# Patient Record
Sex: Male | Born: 1970 | Race: White | Hispanic: No | Marital: Married | Smoking: Never smoker
Health system: Southern US, Community
[De-identification: ages and names within clinical notes are randomized; demographics above are authoritative.]

## PROBLEM LIST (undated history)

## (undated) DIAGNOSIS — E7800 Pure hypercholesterolemia, unspecified: Secondary | ICD-10-CM

---

## 2009-03-15 ENCOUNTER — Ambulatory Visit (HOSPITAL_COMMUNITY): Admission: RE | Admit: 2009-03-15 | Discharge: 2009-03-15 | Payer: Self-pay | Admitting: Family Medicine

## 2011-01-20 ENCOUNTER — Encounter: Payer: Self-pay | Admitting: *Deleted

## 2011-01-20 ENCOUNTER — Emergency Department (HOSPITAL_COMMUNITY)
Admission: EM | Admit: 2011-01-20 | Discharge: 2011-01-20 | Disposition: A | Discharge status: Home / Self Care | Payer: BC Managed Care – PPO | Attending: Emergency Medicine | Admitting: Emergency Medicine

## 2011-01-20 DIAGNOSIS — Z882 Allergy status to sulfonamides status: Secondary | ICD-10-CM | POA: Insufficient documentation

## 2011-01-20 DIAGNOSIS — S61209A Unspecified open wound of unspecified finger without damage to nail, initial encounter: Secondary | ICD-10-CM | POA: Insufficient documentation

## 2011-01-20 DIAGNOSIS — W260XXA Contact with knife, initial encounter: Secondary | ICD-10-CM | POA: Insufficient documentation

## 2011-01-20 DIAGNOSIS — Y92009 Unspecified place in unspecified non-institutional (private) residence as the place of occurrence of the external cause: Secondary | ICD-10-CM | POA: Insufficient documentation

## 2011-01-20 DIAGNOSIS — W261XXA Contact with sword or dagger, initial encounter: Secondary | ICD-10-CM | POA: Insufficient documentation

## 2011-01-20 DIAGNOSIS — Z880 Allergy status to penicillin: Secondary | ICD-10-CM | POA: Insufficient documentation

## 2011-01-20 DIAGNOSIS — S61219A Laceration without foreign body of unspecified finger without damage to nail, initial encounter: Secondary | ICD-10-CM

## 2011-01-20 MED ORDER — TETANUS-DIPHTH-ACELL PERTUSSIS 5-2-15.5 LF-MCG/0.5 IM SUSP: 0.5000 mL | Freq: Once | INTRAMUSCULAR | Status: DC

## 2011-01-20 MED ORDER — TETANUS-DIPHTH-ACELL PERTUSSIS 5-2.5-18.5 LF-MCG/0.5 IM SUSP
INTRAMUSCULAR | Status: AC
  Administered 2011-01-20: 0.5 mL via INTRAMUSCULAR
  Filled 2011-01-20: qty 0.5

## 2011-01-20 NOTE — ED Provider Notes (Signed)
History     CSN: [id number] Arrival date & time: 01/20/2011  6:18 PM   Chief Complaint  Patient presents with  . Extremity Laceration     (Include location/radiation/quality/duration/timing/severity/associated sxs/prior treatment) The history is provided by the patient.   injury to left distal index finger is well working on his truck.  Patient cut his left index finger with a pocket knife just prior to arrival the patient denies weakness or tingling of the finger.  He reports no difficulty history and minimal blood loss at the scene.  Pain is mild.  Nothing worsens or improves the pain. History reviewed. No pertinent past medical history.   History reviewed. No pertinent past surgical history.  No family history on file.  History  Substance Use Topics  . Smoking status: Never Smoker   . Smokeless tobacco: Not on file  . Alcohol Use: No      Review of Systems  All other systems reviewed and are negative.    Allergies  Penicillins and Sulfa antibiotics  Home Medications  No current outpatient prescriptions on file.  Physical Exam    BP 130/78  Pulse 81  Temp(Src) 98.6 F (37 C) (Oral)  Resp 16  SpO2 100%  Physical Exam  Constitutional: He is oriented to person, place, and time. He appears well-developed and well-nourished.  HENT:  Head: Normocephalic.  Eyes: EOM are normal.  Neck: Normal range of motion.  Pulmonary/Chest: Effort normal.  Musculoskeletal: Normal range of motion.       Dorsal surface of left distal phalanx of the index finger small avulsion without bleeding or secondary signs of infection.  This avulsion is on the radial aspect.  Normal distal perfusion.  Normal flexor and extensor tendon function  Neurological: He is alert and oriented to person, place, and time.  Psychiatric: He has a normal mood and affect.    ED Course  LACERATION REPAIR Date/Time: 01/20/2011 7:11 PM Performed by: Lyanne Co Authorized by: Lyanne Co Consent: Verbal consent obtained. Risks and benefits: risks, benefits and alternatives were discussed Consent given by: patient Patient identity confirmed: verbally with patient Body area: upper extremity Location details: left index finger Laceration length: 1 cm Foreign bodies: no foreign bodies Tendon involvement: none Nerve involvement: none Vascular damage: no Anesthesia method: none. Patient sedated: no Amount of cleaning: standard (Cleaned with wet gauze) Skin closure: glue Approximation: close Approximation difficulty: simple Patient tolerance: Patient tolerated the procedure well with no immediate complications.    No results found for this or any previous visit. No results found.   1. Laceration of finger      MDM Tetanus updated.  Superficial laceration repaired with Dermabond.  Infection warnings given       Lyanne Co, MD 01/20/11 [phone]

## 2011-01-20 NOTE — ED Notes (Signed)
Pt's wife called and spoke with me stating she did not like how her husband was treated; I apologized and told her she could call back and speak with the Director and she stated she would be calling her insurance company to let them know that her insurance was not appreciated here and she hung up

## 2011-01-20 NOTE — ED Notes (Signed)
Pt states he cut 2nd left finger with a knife at ~1700. Bleeding controlled. Pt states Last Tdap is unknown but >5 years.

## 2018-12-04 ENCOUNTER — Other Ambulatory Visit: Payer: Self-pay

## 2018-12-04 ENCOUNTER — Encounter (HOSPITAL_COMMUNITY): Payer: Self-pay

## 2018-12-04 ENCOUNTER — Emergency Department (HOSPITAL_COMMUNITY): Payer: 59

## 2018-12-04 ENCOUNTER — Emergency Department (HOSPITAL_COMMUNITY)
Admission: EM | Admit: 2018-12-04 | Discharge: 2018-12-04 | Disposition: A | Discharge status: Home / Self Care | Payer: 59 | Attending: Emergency Medicine | Admitting: Emergency Medicine

## 2018-12-04 DIAGNOSIS — M79644 Pain in right finger(s): Secondary | ICD-10-CM

## 2018-12-04 MED ORDER — CLINDAMYCIN HCL 300 MG PO CAPS: 300.0000 mg | ORAL_CAPSULE | Freq: Three times a day (TID) | ORAL | 0 refills | Status: AC

## 2018-12-04 MED ORDER — CLINDAMYCIN HCL 300 MG PO CAPS: 300.0000 mg | ORAL_CAPSULE | Freq: Three times a day (TID) | ORAL | 0 refills | Status: DC

## 2018-12-04 MED ORDER — HYDROCODONE-ACETAMINOPHEN 5-325 MG PO TABS: 1.0000 | ORAL_TABLET | Freq: Four times a day (QID) | ORAL | 0 refills | Status: DC | PRN

## 2018-12-04 MED ORDER — CLINDAMYCIN HCL 150 MG PO CAPS
300.0000 mg | ORAL_CAPSULE | Freq: Once | ORAL | Status: AC
  Administered 2018-12-04: 22:00:00 300 mg via ORAL
  Filled 2018-12-04: qty 2

## 2018-12-04 NOTE — Discharge Instructions (Signed)
Take the antibiotics as prescribed.  Please do warm soaks to the area approximately 4 times a day.  If you notice additional swelling at the nail bed, drainage please seek reevaluation.  Seems to be an early paronychia which is infection of the nailbed.  These do sometimes require drainage however years does not appear to need drainage at this time.  Take Tylenol and ibuprofen as needed for pain.

## 2018-12-04 NOTE — ED Triage Notes (Signed)
Pt states swelling of right thumb since yesterday. 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 Notable redness to area.

## 2018-12-04 NOTE — ED Provider Notes (Signed)
[facility] EMERGENCY DEPARTMENT Provider Note   CSN: [id number] Arrival date & time: 12/04/18  2035  History   Chief Complaint Chief Complaint  Patient presents with  . Finger Injury   HPI Scott Gould is a 48 y.o. male with no significant past medical history who presents for evaluation of right thumb pain.  Patient states he has had pain to his right distal thumb which began yesterday.  Has been taking ibuprofen without relief of his pain.  Describes his pain as nagging.  Rates his current pain a 6/10.  Denies radiation pain denies prior history of diabetes, osteomyelitis, abscess.  Has noted some mild erythema to the proximal nail fold.  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  Denies additional aggravating or alleviating factors  History obtained from patient and past medical records.  No interpreter is used.     HPI  History reviewed. No pertinent past medical history.  There are no active problems to display for this patient.   No past surgical history on file.      Home Medications    Prior to Admission medications   Medication Sig Start Date End Date Taking? Authorizing Provider  clindamycin (CLEOCIN) 300 MG capsule Take 1 capsule (300 mg total) by mouth 3 (three) times daily for 5 days. 12/04/18 12/09/18  Andrey Mccaskill A, PA-C  HYDROcodone-acetaminophen (NORCO/VICODIN) 5-325 MG tablet Take 1 tablet by mouth every 6 (six) hours as needed for severe pain. 12/04/18   Styles Fambro A, PA-C    Family History No family history on file.  Social History Social History   Tobacco Use  . Smoking status: Never Smoker  Substance Use Topics  . Alcohol use: No  . Drug use: No     Allergies   Penicillins and Sulfa antibiotics   Review of Systems Review of Systems  Constitutional: Negative.   HENT: Negative.   Respiratory: Negative.   Cardiovascular: Negative.   Gastrointestinal: Negative.    Genitourinary: Negative.   Musculoskeletal:       Right thumb pain  Skin: Negative.   Neurological: Negative.   All other systems reviewed and are negative.    Physical Exam Updated Vital Signs BP (!) 147/88 (BP Location: Left Arm)   Pulse 71   Temp 98 F (36.7 C) (Oral)   Resp 14   Ht 5\' 9"  (1.753 m)   Wt 90.7 kg   SpO2 100%   BMI 29.53 kg/m   Physical Exam Vitals signs and nursing note reviewed.  Constitutional:      General: He is not in acute distress.    Appearance: He is well-developed. He is not ill-appearing, toxic-appearing or diaphoretic.  HENT:     Head: Normocephalic and atraumatic.     Mouth/Throat:     Mouth: Mucous membranes are moist.  Eyes:     Pupils: Pupils are equal, round, and reactive to light.  Neck:     Musculoskeletal: Normal range of motion and neck supple.  Cardiovascular:     Rate and Rhythm: Normal rate and regular rhythm.     Pulses: Normal pulses.     Heart sounds: Normal heart sounds.  Pulmonary:     Effort: Pulmonary effort is normal. No respiratory distress.     Breath sounds: Normal breath sounds.  Abdominal:     General: Bowel sounds are normal. There is no distension.     Palpations: Abdomen is soft.  Musculoskeletal:  Normal range of motion.        General: No deformity or signs of injury.     Right elbow: Normal.    Left elbow: Normal.     Right wrist: Normal.     Left wrist: Normal.     Right forearm: Normal.     Left forearm: Normal.     Right hand: He exhibits tenderness. He exhibits normal range of motion, no bony tenderness, normal two-point discrimination, normal capillary refill, no deformity, no laceration and no swelling. Normal sensation noted. Normal strength noted.     Left hand: Normal.     Right lower leg: No edema.     Left lower leg: No edema.     Comments: Full range of motion to Right upper extremity without difficulty.  Full range of motion with flexion and extension of right thumb.  No tenderness  palpation to DIP or metacarpal.  No bony tenderness.  No obvious injury.  No tenderness to thenar eminence.  Skin:    General: Skin is warm and dry.     Comments: Mild erythema to right corner proximal nail fold of right thumb. No evidence of induration or fluctuance. No evidence of drainable abscess.  No rashes or lesions.  Brisk capillary refill.  Neurological:     General: No focal deficit present.     Mental Status: He is alert.     Sensory: Sensation is intact.     Motor: Motor function is intact.     Coordination: Coordination is intact.     Gait: Gait is intact.    ED Treatments / Results  Labs (all labs ordered are listed, but only abnormal results are displayed) Labs Reviewed - No data to display  EKG None  Radiology Dg Finger Thumb Right  Result Date: 12/04/2018 CLINICAL DATA:  Pain and swelling EXAM: RIGHT THUMB 2+V COMPARISON:  None. FINDINGS: Frontal, oblique, and lateral views were obtained. There is soft tissue swelling in the region of the first IP joint. A tiny calcification in the lateral aspect of the first IP joint is likely of arthropathic etiology. No evident acute fracture or dislocation. No joint space narrowing or erosion. IMPRESSION: Soft tissue swelling in the first IP joint. Small calcification in the dorsal, lateral aspect of the first IP joint is likely of arthropathic etiology. No bony destruction or erosion. No appreciable joint space narrowing. No evident fracture or dislocation. Electronically Signed   By: Bretta BangWilliam  Woodruff III M.D.   On: 12/04/2018 21:21    Procedures Procedures (including critical care time)  Medications Ordered in ED Medications  clindamycin (CLEOCIN) capsule 300 mg (has no administration in time range)    Initial Impression / Assessment and Plan / ED Course  I have reviewed the triage vital signs and the nursing notes.  Pertinent labs & imaging results that were available during my care of the patient were reviewed by me and  considered in my medical decision making (see chart for details).  48 year old male appears otherwise well presents for evaluation of left thumb pain.  Onset yesterday.  He is afebrile, nonseptic, non-ill-appearing.  Patient with mild erythema to right corner nail fold.  No evidence of fluctuance or induration.  Normal musculoskeletal exam with passive and active range of motion.Marland Kitchen.  He is neurovascularly intact without deficit.  No bony tenderness.  Exam consistent with early paronychia.  I do not see any evidence of obvious fluctuance to drain in the emergency department at this time.  No  evidence of herpetic whitlow, osteomyelitis, cellulitis.  Compartments soft.  No streaking. Exam unconcerning for flexor tenosynovitis. Dg thumb with soft tissue swelling. No underlying osteomyelitis, gas forming organism, fracture, dislocation, retain foreign body. Low suspicion for septic joint, gout, hemarthrosis.  Will DC with antibiotics, pain medicine, warm soaks.  Discussed with patient if area becomes fluctuant he may need drainage at that time and to seek reevaluation.   The patient has been appropriately medically screened and/or stabilized in the ED. I have low suspicion for any other emergent medical condition which would require further screening, evaluation or treatment in the ED or require inpatient management.  Patient is hemodynamically stable and in no acute distress.  Patient able to ambulate in department prior to ED.  Evaluation does not show acute pathology that would require ongoing or additional emergent interventions while in the emergency department or further inpatient treatment.  I have discussed the diagnosis with the patient and answered all questions.  Pain is been managed while in the emergency department and patient has no further complaints prior to discharge.  Patient is comfortable with plan discussed in room and is stable for discharge at this time.  I have discussed strict return  precautions for returning to the emergency department.  Patient was encouraged to follow-up with PCP/specialist refer to at discharge.     Final Clinical Impressions(s) / ED Diagnoses   Final diagnoses:  Pain of right thumb    ED Discharge Orders         Ordered    clindamycin (CLEOCIN) 300 MG capsule  3 times daily,   Status:  Discontinued     12/04/18 2114    HYDROcodone-acetaminophen (NORCO/VICODIN) 5-325 MG tablet  Every 6 hours PRN     12/04/18 2127    clindamycin (CLEOCIN) 300 MG capsule  3 times daily     12/04/18 2130           Hoby Kawai A, PA-C 12/04/18 2132    Fredia Sorrow, MD 12/09/18 [phone]

## 2019-05-13 ENCOUNTER — Other Ambulatory Visit: Payer: Self-pay

## 2019-05-13 ENCOUNTER — Ambulatory Visit: Payer: 59 | Attending: Internal Medicine

## 2019-05-13 DIAGNOSIS — Z20822 Contact with and (suspected) exposure to covid-19: Secondary | ICD-10-CM

## 2019-05-14 LAB — NOVEL CORONAVIRUS, NAA: SARS-CoV-2, NAA: NOT DETECTED

## 2019-06-03 ENCOUNTER — Other Ambulatory Visit: Payer: Self-pay

## 2019-06-03 ENCOUNTER — Ambulatory Visit: Payer: 59 | Attending: Internal Medicine

## 2019-06-03 DIAGNOSIS — Z20822 Contact with and (suspected) exposure to covid-19: Secondary | ICD-10-CM

## 2019-06-04 ENCOUNTER — Ambulatory Visit: Payer: Self-pay | Attending: Internal Medicine

## 2019-06-04 LAB — NOVEL CORONAVIRUS, NAA: SARS-CoV-2, NAA: NOT DETECTED

## 2020-09-25 IMAGING — CR RIGHT THUMB 2+V
1 series · 3 of 3 positions shown · non-contrast
Comparison: None.

CLINICAL DATA: Pain and swelling

EXAM:
RIGHT THUMB 2+V

[Series 2: pa · 0.17mm/px · 3 of 3 slices shown]
[im 1/3]
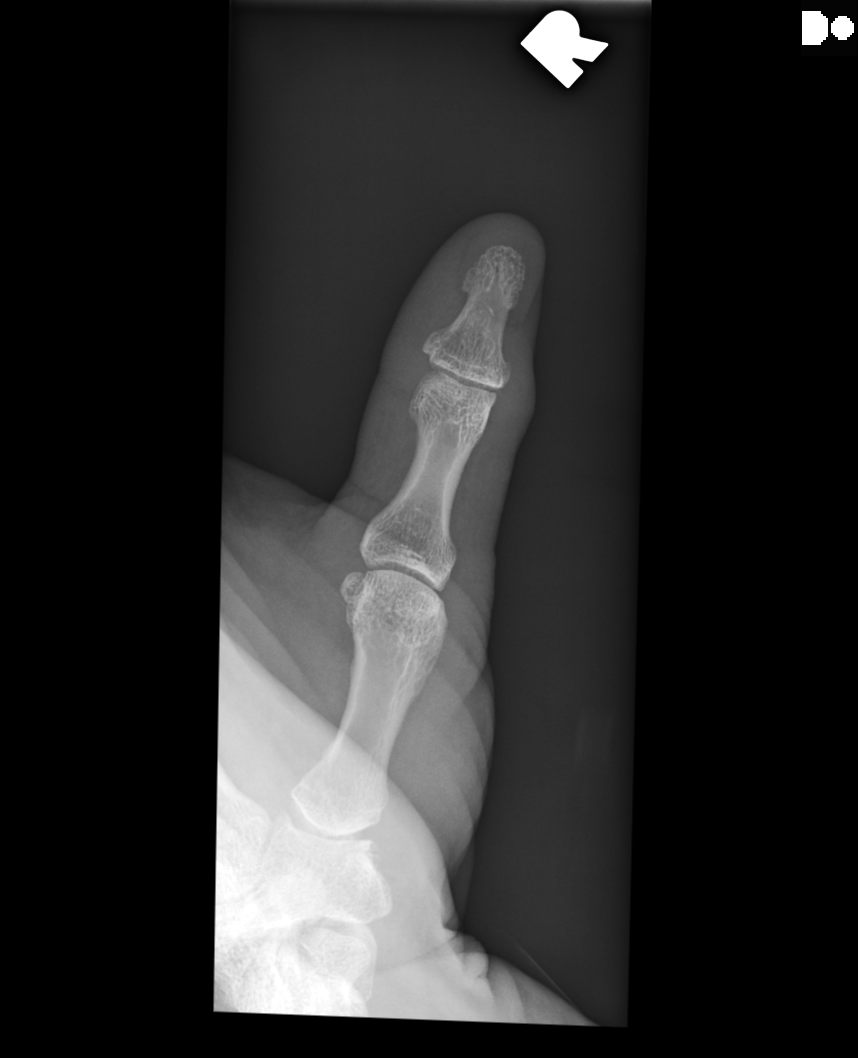
[im 2/3]
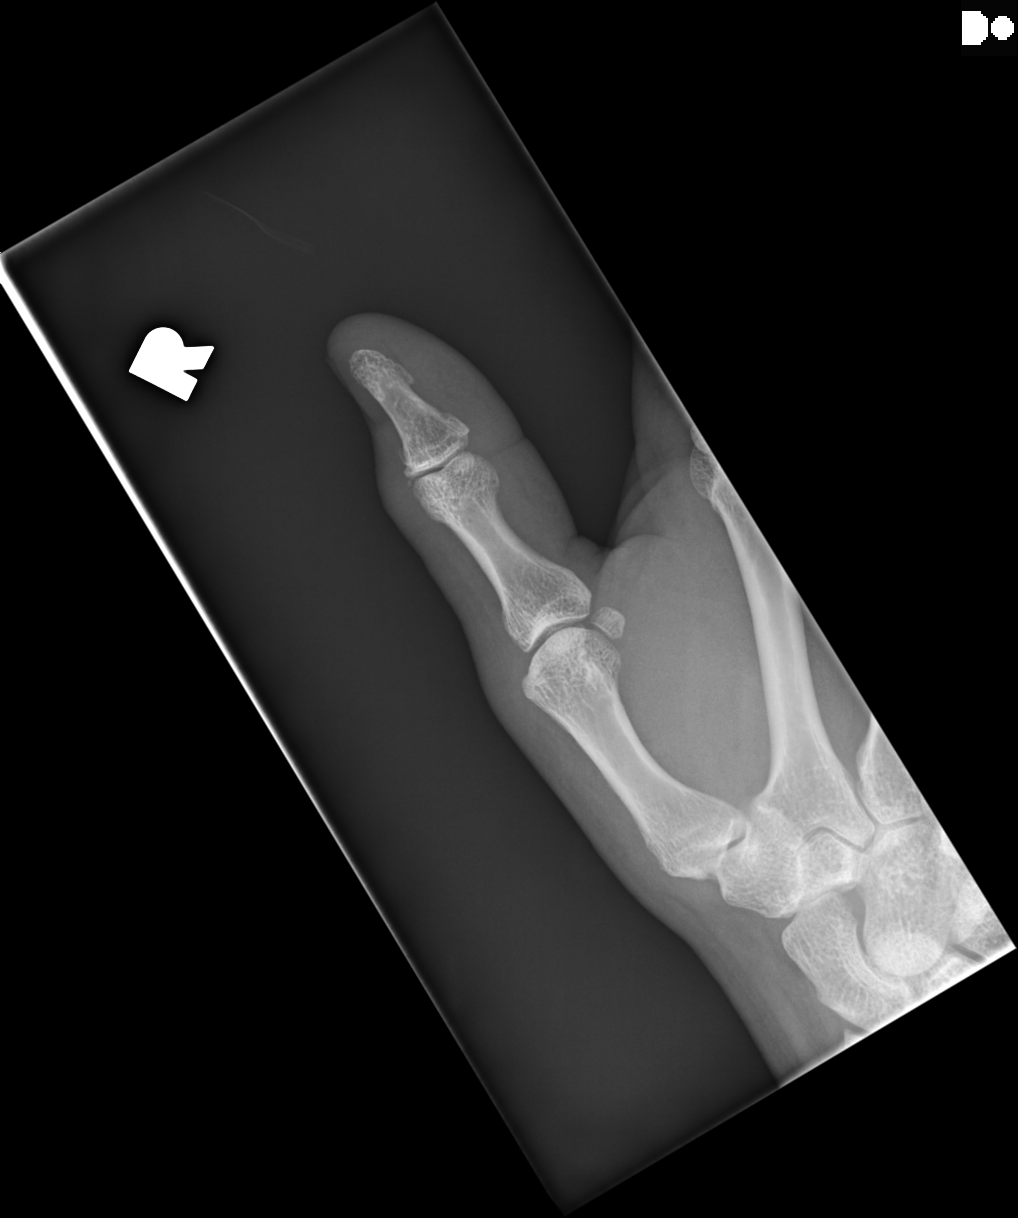
[im 3/3]
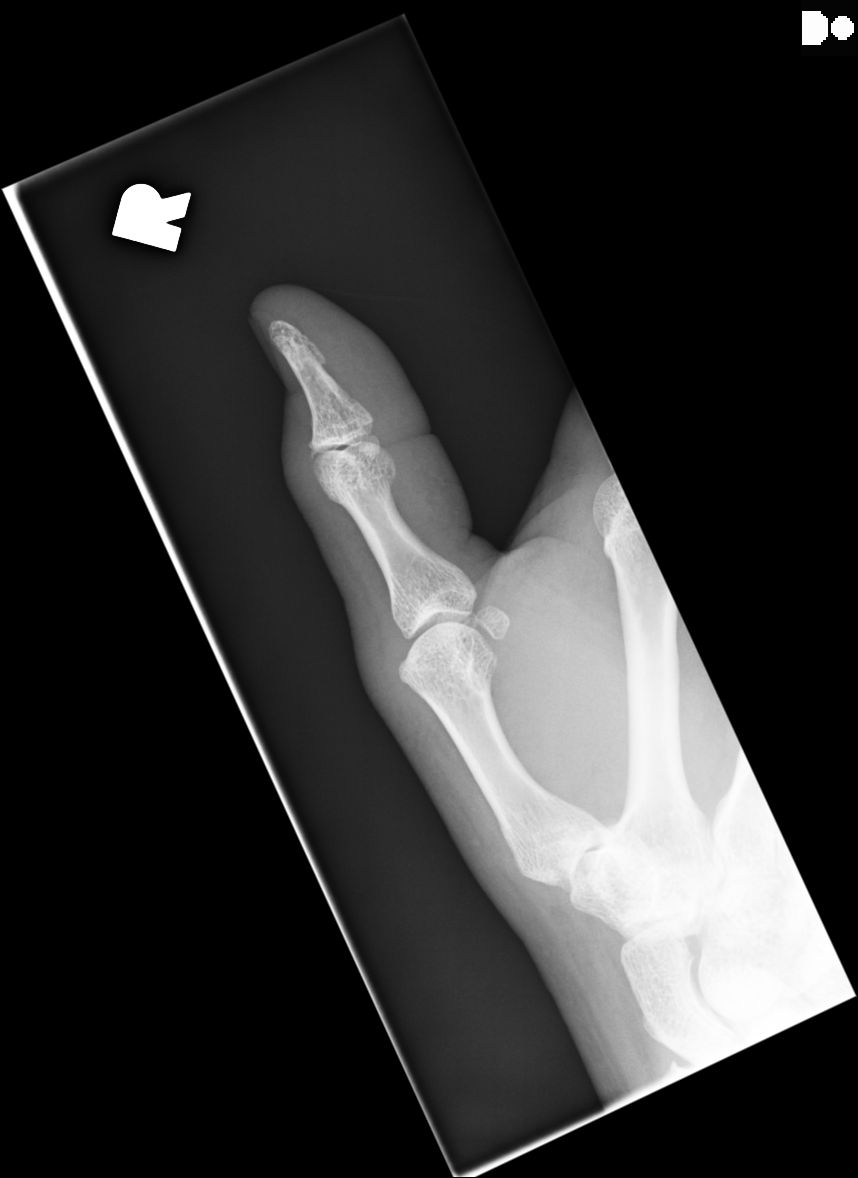

[3 of 3 positions shown; findings below may reference images not displayed]

FINDINGS: Frontal, oblique, and lateral views were obtained. There is soft
tissue swelling in the region of the first IP joint. A tiny
calcification in the lateral aspect of the first IP joint is likely
of arthropathic etiology. No evident acute fracture or dislocation.
No joint space narrowing or erosion.
IMPRESSION: Soft tissue swelling in the first IP joint. Small calcification in
the dorsal, lateral aspect of the first IP joint is likely of
arthropathic etiology. No bony destruction or erosion. No
appreciable joint space narrowing. No evident fracture or
dislocation.

## 2021-11-16 ENCOUNTER — Other Ambulatory Visit: Payer: Self-pay

## 2021-11-16 ENCOUNTER — Encounter (HOSPITAL_COMMUNITY): Payer: Self-pay

## 2021-11-16 ENCOUNTER — Emergency Department (HOSPITAL_COMMUNITY)
Admission: EM | Admit: 2021-11-16 | Discharge: 2021-11-16 | Disposition: A | Discharge status: Home / Self Care | Payer: 59 | Attending: Student | Admitting: Student

## 2021-11-16 DIAGNOSIS — S50361A Insect bite (nonvenomous) of right elbow, initial encounter: Secondary | ICD-10-CM | POA: Insufficient documentation

## 2021-11-16 DIAGNOSIS — W57XXXA Bitten or stung by nonvenomous insect and other nonvenomous arthropods, initial encounter: Secondary | ICD-10-CM | POA: Insufficient documentation

## 2021-11-16 DIAGNOSIS — T63444A Toxic effect of venom of bees, undetermined, initial encounter: Secondary | ICD-10-CM

## 2021-11-16 NOTE — ED Triage Notes (Signed)
Pt took 2 benadry at 1600

## 2021-11-16 NOTE — ED Triage Notes (Signed)
Pt presents with bee sting to right elbow about 4 hours ago, has redness and swelling to area and reports itching, no SOB or facial swelling noted

## 2021-11-17 NOTE — ED Provider Notes (Signed)
[facility] EMERGENCY DEPARTMENT Provider Note  CSN: [id number] Arrival date & time: 11/16/21 1708  Chief Complaint(s) Insect Bite  HPI Scott Gould is a 51 y.o. male who presents emergency department for evaluation of a bee sting.  Patient states that he was working outside when he was stung in the elbow by a bee.  He remove the stinger prior to arrival.  He states that he had multiple urticarial lesions over the arms and trunk and took 50 mg of Benadryl p.o. at home prior to arrival.  At no point did he have shortness of breath, wheezing, dysphagia, oral swelling.  On arrival, he states that his symptoms are much improved and his urticarial raised lesions have mostly resolved.  Here in the emergency department he denies chest pain, shortness of breath, abdominal pain, nausea, vomiting, difficulty swallowing or any other systemic or allergic symptoms.   Past Medical History History reviewed. No pertinent past medical history. There are no problems to display for this patient.  Home Medication(s) Prior to Admission medications   Medication Sig Start Date End Date Taking? Authorizing Provider  HYDROcodone-acetaminophen (NORCO/VICODIN) 5-325 MG tablet Take 1 tablet by mouth every 6 (six) hours as needed for severe pain. 12/04/18   Henderly, Britni A, PA-C                                                                                                                                    Past Surgical History History reviewed. No pertinent surgical history. Family History History reviewed. No pertinent family history.  Social History Social History   Tobacco Use   Smoking status: Never  Substance Use Topics   Alcohol use: No   Drug use: No   Allergies Penicillins and Sulfa antibiotics  Review of Systems Review of Systems  Skin:  Positive for rash.    Physical Exam Vital Signs  I have reviewed the triage vital signs BP 136/89 (BP Location: Left Arm)   Pulse 88   Temp 98.5 F  (36.9 C) (Oral)   Resp 20   Ht 5\' 9"  (1.753 m)   Wt 91.6 kg   SpO2 96%   BMI 29.83 kg/m   Physical Exam Constitutional:      General: He is not in acute distress.    Appearance: Normal appearance.  HENT:     Head: Normocephalic and atraumatic.     Nose: No congestion or rhinorrhea.  Eyes:     General:        Right eye: No discharge.        Left eye: No discharge.     Extraocular Movements: Extraocular movements intact.     Pupils: Pupils are equal, round, and reactive to light.  Cardiovascular:     Rate and Rhythm: Normal rate and regular rhythm.     Heart sounds: No murmur heard. Pulmonary:     Effort: No respiratory distress.  Breath sounds: No wheezing or rales.  Abdominal:     General: There is no distension.     Tenderness: There is no abdominal tenderness.  Musculoskeletal:        General: Normal range of motion.     Cervical back: Normal range of motion.  Skin:    General: Skin is warm and dry.     Findings: Erythema and rash present.  Neurological:     General: No focal deficit present.     Mental Status: He is alert.     ED Results and Treatments Labs (all labs ordered are listed, but only abnormal results are displayed) Labs Reviewed - No data to display                                                                                                                        Radiology No results found.  Pertinent labs & imaging results that were available during my care of the patient were reviewed by me and considered in my medical decision making (see MDM for details).  Medications Ordered in ED Medications - No data to display                                                                                                                                   Procedures Procedures  (including critical care time)  Medical Decision Making / ED Course   This patient presents to the ED for concern of bee sting, this involves an extensive number of  treatment options, and is a complaint that carries with it a high risk of complications and morbidity.  The differential diagnosis includes allergic reaction, urticaria, anaphylaxis, local irritation  MDM: Patient seen emergency room for evaluation of a bee sting.  Physical exam with erythema and swelling to the right elbow at the site of the primary bee sting with appropriately fading urticaria over the trunk on the right.  His oropharynx is clear with no uvular swelling, deviation or oropharyngeal swelling.  At this time, the patient appears to be improving from his home Benadryl use and additional medications are not required.  He does not meet anaphylaxis criteria for epinephrine administration and he was encouraged to continue taking 25 mg p.o. twice daily as needed until resolution of symptoms.  He was given strict return precautions which include throat swelling, wheezing, shortness of breath, difficulty swallowing or return of symptoms of which  he voiced understanding.  Patient then discharge.   Additional history obtained: -Additional history obtained from wife -External records from outside source obtained and reviewed including: Chart review including previous notes, labs, imaging, consultation notes     Medicines ordered and prescription drug management: No orders of the defined types were placed in this encounter.   -I have reviewed the patients home medicines and have made adjustments as needed  Critical interventions none   Social Determinants of Health:  Factors impacting patients care include: none   Reevaluation: After the interventions noted above, I reevaluated the patient and found that they have :improved  Co morbidities that complicate the patient evaluation History reviewed. No pertinent past medical history.    Dispostion: I considered admission for this patient, but he does not meet inpatient criteria for admission is safe for discharge to outpatient  follow-up     Final Clinical Impression(s) / ED Diagnoses Final diagnoses:  Insect bite of right elbow, initial encounter  Bee sting reaction, undetermined intent, initial encounter     @PCDICTATION @    , MD 11/17/21 1228

## 2023-06-27 ENCOUNTER — Ambulatory Visit
Admission: RE | Admit: 2023-06-27 | Discharge: 2023-06-27 | Disposition: A | Discharge status: Home / Self Care | Payer: 59 | Source: Ambulatory Visit | Attending: Family Medicine | Admitting: Family Medicine

## 2023-06-27 VITALS — BP 147/91 | HR 113 | Temp 99.9°F | Resp 18

## 2023-06-27 DIAGNOSIS — J069 Acute upper respiratory infection, unspecified: Secondary | ICD-10-CM | POA: Diagnosis not present

## 2023-06-27 HISTORY — DX: Pure hypercholesterolemia, unspecified: E78.00

## 2023-06-27 LAB — POC COVID19/FLU A&B COMBO
Covid Antigen, POC: NEGATIVE
Influenza A Antigen, POC: NEGATIVE
Influenza B Antigen, POC: NEGATIVE

## 2023-06-27 MED ORDER — FLUTICASONE PROPIONATE 50 MCG/ACT NA SUSP: 1.0000 | Freq: Two times a day (BID) | NASAL | 2 refills | Status: AC

## 2023-06-27 MED ORDER — OSELTAMIVIR PHOSPHATE 75 MG PO CAPS: 75.0000 mg | ORAL_CAPSULE | Freq: Two times a day (BID) | ORAL | 0 refills | Status: AC

## 2023-06-27 MED ORDER — PROMETHAZINE-DM 6.25-15 MG/5ML PO SYRP: 5.0000 mL | ORAL_SOLUTION | Freq: Four times a day (QID) | ORAL | 0 refills | Status: AC | PRN

## 2023-06-27 NOTE — ED Provider Notes (Signed)
 [facility]    CSN: [id number] Arrival date & time: 06/27/23  1309      History   Chief Complaint Chief Complaint  Patient presents with   Cough    Entered by patient    HPI Scott Gould is a 53 y.o. male.   Per HPI presenting today with 1 day history of cough, congestion, fever, body aches, fatigue.  Denies chest pain, shortness of breath, abdominal pain, vomiting, diarrhea.  So far trying over-the-counter remedies with minimal relief.  No known sick contacts recently.    Past Medical History:  Diagnosis Date   High cholesterol     There are no active problems to display for this patient.   History reviewed. No pertinent surgical history.     Home Medications    Prior to Admission medications   Medication Sig Start Date End Date Taking? Authorizing Provider  fluticasone (FLONASE) 50 MCG/ACT nasal spray Place 1 spray into both nostrils 2 (two) times daily. 06/27/23  Yes Particia Nearing, PA-C  oseltamivir (TAMIFLU) 75 MG capsule Take 1 capsule (75 mg total) by mouth every 12 (twelve) hours. 06/27/23  Yes Particia Nearing, PA-C  promethazine-dextromethorphan (PROMETHAZINE-DM) 6.25-15 MG/5ML syrup Take 5 mLs by mouth 4 (four) times daily as needed. 06/27/23  Yes Particia Nearing, PA-C    Family History History reviewed. No pertinent family history.  Social History Social History   Tobacco Use   Smoking status: Never   Smokeless tobacco: Never  Vaping Use   Vaping status: Never Used  Substance Use Topics   Alcohol use: No   Drug use: No     Allergies   Penicillins and Sulfa antibiotics   Review of Systems Review of Systems Per HPI  Physical Exam Triage Vital Signs ED Triage Vitals  Encounter Vitals Group     BP 06/27/23 1314 (!) 147/91     Systolic BP Percentile --      Diastolic BP Percentile --      Pulse Rate 06/27/23 1314 (!) 113     Resp 06/27/23 1314 18     Temp 06/27/23 1314 99.9 F (37.7 C)     Temp  Source 06/27/23 1314 Oral     SpO2 06/27/23 1314 94 %     Weight --      Height --      Head Circumference --      Peak Flow --      Pain Score 06/27/23 1315 2     Pain Loc --      Pain Education --      Exclude from Growth Chart --    No data found.  Updated Vital Signs BP (!) 147/91 (BP Location: Right Arm)   Pulse (!) 113   Temp 99.9 F (37.7 C) (Oral)   Resp 18   SpO2 94%   Visual Acuity Right Eye Distance:   Left Eye Distance:   Bilateral Distance:    Right Eye Near:   Left Eye Near:    Bilateral Near:     Physical Exam Vitals and nursing note reviewed.  Constitutional:      Appearance: He is well-developed.  HENT:     Head: Atraumatic.     Right Ear: External ear normal.     Left Ear: External ear normal.     Nose: Rhinorrhea present.     Mouth/Throat:     Pharynx: Posterior oropharyngeal erythema present. No oropharyngeal exudate.  Eyes:  Conjunctiva/sclera: Conjunctivae normal.     Pupils: Pupils are equal, round, and reactive to light.  Cardiovascular:     Rate and Rhythm: Normal rate and regular rhythm.  Pulmonary:     Effort: Pulmonary effort is normal. No respiratory distress.     Breath sounds: No wheezing or rales.  Musculoskeletal:        General: Normal range of motion.     Cervical back: Normal range of motion and neck supple.  Lymphadenopathy:     Cervical: No cervical adenopathy.  Skin:    General: Skin is warm and dry.  Neurological:     Mental Status: He is alert and oriented to person, place, and time.     Motor: No weakness.     Gait: Gait normal.  Psychiatric:        Behavior: Behavior normal.      UC Treatments / Results  Labs (all labs ordered are listed, but only abnormal results are displayed) Labs Reviewed  POC COVID19/FLU A&B COMBO    EKG   Radiology No results found.  Procedures Procedures (including critical care time)  Medications Ordered in UC Medications - No data to display  Initial Impression  / Assessment and Plan / UC Course  I have reviewed the triage vital signs and the nursing notes.  Pertinent labs & imaging results that were available during my care of the patient were reviewed by me and considered in my medical decision making (see chart for details).     Suspect viral upper respiratory infection, possibly influenza but negative COVID and flu today.  Will treat with Tamiflu, Phenergan DM, Flonase, supportive over-the-counter medications and home care.  Return for worsening symptoms.  Final Clinical Impressions(s) / UC Diagnoses   Final diagnoses:  Viral URI with cough   Discharge Instructions   None    ED Prescriptions     Medication Sig Dispense Auth. Provider   promethazine-dextromethorphan (PROMETHAZINE-DM) 6.25-15 MG/5ML syrup Take 5 mLs by mouth 4 (four) times daily as needed. 100 mL Particia Nearing, PA-C   fluticasone [facility]) 50 MCG/ACT nasal spray Place 1 spray into both nostrils 2 (two) times daily. 16 g Particia Nearing, [place]   oseltamivir (TAMIFLU) 75 MG capsule Take 1 capsule (75 mg total) by mouth every 12 (twelve) hours. 10 capsule Particia Nearing, [place]      PDMP not reviewed this encounter.   Particia Nearing, [place] 06/27/23 1423

## 2023-06-27 NOTE — ED Triage Notes (Signed)
 Cough and nasal congestion and fever since yesterday. C/o body aches

## 2023-11-02 ENCOUNTER — Ambulatory Visit
Admission: RE | Admit: 2023-11-02 | Discharge: 2023-11-02 | Disposition: A | Discharge status: Home / Self Care | Source: Ambulatory Visit | Attending: Family Medicine | Admitting: Family Medicine

## 2023-11-02 ENCOUNTER — Other Ambulatory Visit: Payer: Self-pay

## 2023-11-02 VITALS — BP 160/94 | HR 90 | Temp 99.1°F | Resp 20

## 2023-11-02 DIAGNOSIS — J208 Acute bronchitis due to other specified organisms: Secondary | ICD-10-CM | POA: Diagnosis not present

## 2023-11-02 DIAGNOSIS — R030 Elevated blood-pressure reading, without diagnosis of hypertension: Secondary | ICD-10-CM | POA: Diagnosis not present

## 2023-11-02 MED ORDER — PREDNISONE 20 MG PO TABS: 40.0000 mg | ORAL_TABLET | Freq: Every day | ORAL | 0 refills | Status: AC

## 2023-11-02 MED ORDER — PROMETHAZINE-DM 6.25-15 MG/5ML PO SYRP: 5.0000 mL | ORAL_SOLUTION | Freq: Four times a day (QID) | ORAL | 0 refills | Status: AC | PRN

## 2023-11-02 NOTE — ED Provider Notes (Signed)
 [facility]    CSN: [id number] Arrival date & time: 11/02/23  1338      History   Chief Complaint Chief Complaint  Patient presents with   Cough    Entered by patient    HPI Scott Gould is a 53 y.o. male.   Patient presenting today with 5-day history of hacking cough, congestion, intermittent fevers.  Denies chest pain, shortness of breath, abdominal pain, vomiting, diarrhea.  So far trying over-the-counter cold and congestion medication but states no relief of the cough.  No known history of chronic pulmonary disease.    Past Medical History:  Diagnosis Date   High cholesterol     There are no active problems to display for this patient.   History reviewed. No pertinent surgical history.     Home Medications    Prior to Admission medications   Medication Sig Start Date End Date Taking? Authorizing Provider  predniSONE (DELTASONE) 20 MG tablet Take 2 tablets (40 mg total) by mouth daily with breakfast. 11/02/23  Yes Stuart Vernell Norris, PA-C  promethazine -dextromethorphan (PROMETHAZINE -DM) 6.25-15 MG/5ML syrup Take 5 mLs by mouth 4 (four) times daily as needed. 11/02/23  Yes Stuart Vernell Norris, PA-C  fluticasone  (FLONASE ) 50 MCG/ACT nasal spray Place 1 spray into both nostrils 2 (two) times daily. 06/27/23   Stuart Vernell Norris, PA-C  oseltamivir  (TAMIFLU ) 75 MG capsule Take 1 capsule (75 mg total) by mouth every 12 (twelve) hours. 06/27/23   Stuart Vernell Norris, PA-C  promethazine -dextromethorphan (PROMETHAZINE -DM) 6.25-15 MG/5ML syrup Take 5 mLs by mouth 4 (four) times daily as needed. 06/27/23   Stuart Vernell Norris, PA-C    Family History History reviewed. No pertinent family history.  Social History Social History   Tobacco Use   Smoking status: Never   Smokeless tobacco: Never  Vaping Use   Vaping status: Never Used  Substance Use Topics   Alcohol use: No   Drug use: No     Allergies   Penicillins and Sulfa  antibiotics   Review of Systems Review of Systems Per HPI  Physical Exam Triage Vital Signs ED Triage Vitals [11/02/23 1404]  Encounter Vitals Group     BP (!) 160/94     Girls Systolic BP Percentile      Girls Diastolic BP Percentile      Boys Systolic BP Percentile      Boys Diastolic BP Percentile      Pulse Rate 90     Resp 20     Temp 99.1 F (37.3 C)     Temp Source Oral     SpO2 96 %     Weight      Height      Head Circumference      Peak Flow      Pain Score 0     Pain Loc      Pain Education      Exclude from Growth Chart    No data found.  Updated Vital Signs BP (!) 160/94 (BP Location: Right Arm)   Pulse 90   Temp 99.1 F (37.3 C) (Oral)   Resp 20   SpO2 96%   Visual Acuity Right Eye Distance:   Left Eye Distance:   Bilateral Distance:    Right Eye Near:   Left Eye Near:    Bilateral Near:     Physical Exam Vitals and nursing note reviewed.  Constitutional:      Appearance: He is well-developed.  HENT:  Head: Atraumatic.     Right Ear: External ear normal.     Left Ear: External ear normal.     Nose: Rhinorrhea present.     Mouth/Throat:     Pharynx: Posterior oropharyngeal erythema present. No oropharyngeal exudate.   Eyes:     Conjunctiva/sclera: Conjunctivae normal.     Pupils: Pupils are equal, round, and reactive to light.    Cardiovascular:     Rate and Rhythm: Normal rate and regular rhythm.  Pulmonary:     Effort: Pulmonary effort is normal. No respiratory distress.     Breath sounds: No wheezing or rales.   Musculoskeletal:        General: Normal range of motion.     Cervical back: Normal range of motion and neck supple.  Lymphadenopathy:     Cervical: No cervical adenopathy.   Skin:    General: Skin is warm and dry.   Neurological:     Mental Status: He is alert and oriented to person, place, and time.   Psychiatric:        Behavior: Behavior normal.      UC Treatments / Results  Labs (all labs  ordered are listed, but only abnormal results are displayed) Labs Reviewed - No data to display  EKG   Radiology No results found.  Procedures Procedures (including critical care time)  Medications Ordered in UC Medications - No data to display  Initial Impression / Assessment and Plan / UC Course  I have reviewed the triage vital signs and the nursing notes.  Pertinent labs & imaging results that were available during my care of the patient were reviewed by me and considered in my medical decision making (see chart for details).     Hypertensive in triage, otherwise vital signs reassuring.  He is overall well-appearing and in no acute distress but given duration of symptoms and worsening course will treat with prednisone for viral bronchitis, Phenergan  DM, supportive over-the-counter medications and home care.  Monitor home blood pressures as able and follow-up with PCP if remaining elevated.  Did discuss safe over-the-counter cold and congestion medications that will not elevate blood pressure.  Final Clinical Impressions(s) / UC Diagnoses   Final diagnoses:  Viral bronchitis  Elevated blood pressure reading     Discharge Instructions      In addition to the prescribed medications, you may take plain Mucinex and Coricidin HBP as needed.  Neither of these will elevate your blood pressure readings.  Your blood pressure is slightly elevated today in clinic which may be due to you not feeling well but also may be due to the over-the-counter cold and congestion medications that tend to have decongestants that elevate your blood pressure.  Take these very sparingly or switch to the recommended medications that will not mess with your blood pressure.  Follow-up for significantly worsening symptoms.    ED Prescriptions     Medication Sig Dispense Auth. Provider   predniSONE (DELTASONE) 20 MG tablet Take 2 tablets (40 mg total) by mouth daily with breakfast. 10 tablet Stuart Vernell Norris, PA-C   promethazine -dextromethorphan (PROMETHAZINE -DM) 6.25-15 MG/5ML syrup Take 5 mLs by mouth 4 (four) times daily as needed. 100 mL Stuart Vernell Norris, [place]      PDMP not reviewed this encounter.   Stuart Vernell Norris, PA-C 11/02/23 1432

## 2023-11-02 NOTE — Discharge Instructions (Signed)
 In addition to the prescribed medications, you may take plain Mucinex and Coricidin HBP as needed.  Neither of these will elevate your blood pressure readings.  Your blood pressure is slightly elevated today in clinic which may be due to you not feeling well but also may be due to the over-the-counter cold and congestion medications that tend to have decongestants that elevate your blood pressure.  Take these very sparingly or switch to the recommended medications that will not mess with your blood pressure.  Follow-up for significantly worsening symptoms.

## 2023-11-02 NOTE — ED Triage Notes (Signed)
 Pt reports cough, congestion since 6/24. Intermittent low grade fevers.
